# Patient Record
Sex: Female | Born: 1945 | Race: White | Hispanic: No
Health system: Southern US, Community
[De-identification: ages and names within clinical notes are randomized; demographics above are authoritative.]

---

## 2001-03-09 ENCOUNTER — Encounter: Payer: Self-pay | Admitting: Family Medicine

## 2001-03-09 ENCOUNTER — Ambulatory Visit (HOSPITAL_COMMUNITY): Admission: RE | Admit: 2001-03-09 | Discharge: 2001-03-09 | Payer: Self-pay | Admitting: Family Medicine

## 2001-04-23 ENCOUNTER — Encounter: Payer: Self-pay | Admitting: Family Medicine

## 2001-04-23 ENCOUNTER — Ambulatory Visit (HOSPITAL_COMMUNITY): Admission: RE | Admit: 2001-04-23 | Discharge: 2001-04-23 | Payer: Self-pay | Admitting: Family Medicine

## 2001-05-26 ENCOUNTER — Ambulatory Visit (HOSPITAL_COMMUNITY): Admission: RE | Admit: 2001-05-26 | Discharge: 2001-05-26 | Payer: Self-pay | Admitting: Family Medicine

## 2001-05-26 ENCOUNTER — Encounter: Payer: Self-pay | Admitting: Family Medicine

## 2001-12-02 ENCOUNTER — Encounter: Payer: Self-pay | Admitting: Family Medicine

## 2001-12-02 ENCOUNTER — Ambulatory Visit (HOSPITAL_COMMUNITY): Admission: RE | Admit: 2001-12-02 | Discharge: 2001-12-02 | Payer: Self-pay | Admitting: Family Medicine

## 2002-03-11 ENCOUNTER — Encounter: Payer: Self-pay | Admitting: Family Medicine

## 2002-03-11 ENCOUNTER — Ambulatory Visit (HOSPITAL_COMMUNITY): Admission: RE | Admit: 2002-03-11 | Discharge: 2002-03-11 | Payer: Self-pay | Admitting: Family Medicine

## 2002-03-15 ENCOUNTER — Encounter: Payer: Self-pay | Admitting: Family Medicine

## 2002-03-15 ENCOUNTER — Ambulatory Visit (HOSPITAL_COMMUNITY): Admission: RE | Admit: 2002-03-15 | Discharge: 2002-03-15 | Payer: Self-pay | Admitting: Family Medicine

## 2002-06-16 ENCOUNTER — Encounter: Payer: Self-pay | Admitting: Family Medicine

## 2002-06-16 ENCOUNTER — Ambulatory Visit (HOSPITAL_COMMUNITY): Admission: RE | Admit: 2002-06-16 | Discharge: 2002-06-16 | Payer: Self-pay | Admitting: Family Medicine

## 2002-06-23 ENCOUNTER — Encounter: Payer: Self-pay | Admitting: Family Medicine

## 2002-06-23 ENCOUNTER — Encounter: Admission: RE | Admit: 2002-06-23 | Discharge: 2002-06-23 | Payer: Self-pay | Admitting: Family Medicine

## 2002-07-06 ENCOUNTER — Encounter: Admission: RE | Admit: 2002-07-06 | Discharge: 2002-07-06 | Payer: Self-pay | Admitting: General Surgery

## 2002-07-06 ENCOUNTER — Encounter: Payer: Self-pay | Admitting: General Surgery

## 2002-07-07 ENCOUNTER — Encounter: Payer: Self-pay | Admitting: General Surgery

## 2002-07-07 ENCOUNTER — Encounter: Admission: RE | Admit: 2002-07-07 | Discharge: 2002-07-07 | Payer: Self-pay | Admitting: General Surgery

## 2002-07-07 ENCOUNTER — Ambulatory Visit (HOSPITAL_BASED_OUTPATIENT_CLINIC_OR_DEPARTMENT_OTHER): Admission: RE | Admit: 2002-07-07 | Discharge: 2002-07-07 | Payer: Self-pay | Admitting: General Surgery

## 2002-07-08 ENCOUNTER — Emergency Department (HOSPITAL_COMMUNITY): Admission: EM | Admit: 2002-07-08 | Discharge: 2002-07-09 | Payer: Self-pay | Admitting: *Deleted

## 2002-07-19 ENCOUNTER — Ambulatory Visit: Admission: RE | Admit: 2002-07-19 | Discharge: 2002-07-26 | Payer: Self-pay | Admitting: Radiation Oncology

## 2002-08-02 ENCOUNTER — Encounter: Payer: Self-pay | Admitting: Oncology

## 2002-08-02 ENCOUNTER — Ambulatory Visit (HOSPITAL_COMMUNITY): Admission: RE | Admit: 2002-08-02 | Discharge: 2002-08-02 | Payer: Self-pay | Admitting: Oncology

## 2002-08-04 ENCOUNTER — Ambulatory Visit (HOSPITAL_COMMUNITY): Admission: RE | Admit: 2002-08-04 | Discharge: 2002-08-04 | Payer: Self-pay | Admitting: Oncology

## 2002-08-04 ENCOUNTER — Encounter: Payer: Self-pay | Admitting: Oncology

## 2002-08-23 ENCOUNTER — Ambulatory Visit (HOSPITAL_BASED_OUTPATIENT_CLINIC_OR_DEPARTMENT_OTHER): Admission: RE | Admit: 2002-08-23 | Discharge: 2002-08-23 | Payer: Self-pay | Admitting: General Surgery

## 2002-08-23 ENCOUNTER — Encounter: Payer: Self-pay | Admitting: General Surgery

## 2002-09-19 ENCOUNTER — Inpatient Hospital Stay (HOSPITAL_COMMUNITY): Admission: EM | Admit: 2002-09-19 | Discharge: 2002-09-21 | Payer: Self-pay | Admitting: Emergency Medicine

## 2003-01-03 ENCOUNTER — Ambulatory Visit: Admission: RE | Admit: 2003-01-03 | Discharge: 2003-03-22 | Payer: Self-pay | Admitting: *Deleted

## 2003-01-17 ENCOUNTER — Ambulatory Visit (HOSPITAL_BASED_OUTPATIENT_CLINIC_OR_DEPARTMENT_OTHER): Admission: RE | Admit: 2003-01-17 | Discharge: 2003-01-17 | Payer: Self-pay | Admitting: General Surgery

## 2003-02-15 ENCOUNTER — Ambulatory Visit (HOSPITAL_COMMUNITY): Admission: RE | Admit: 2003-02-15 | Discharge: 2003-02-15 | Payer: Self-pay | Admitting: Ophthalmology

## 2003-06-29 ENCOUNTER — Encounter: Admission: RE | Admit: 2003-06-29 | Discharge: 2003-06-29 | Payer: Self-pay | Admitting: Oncology

## 2003-07-07 ENCOUNTER — Encounter: Admission: RE | Admit: 2003-07-07 | Discharge: 2003-07-07 | Payer: Self-pay | Admitting: Oncology

## 2003-11-22 ENCOUNTER — Emergency Department (HOSPITAL_COMMUNITY): Admission: EM | Admit: 2003-11-22 | Discharge: 2003-11-22 | Payer: Self-pay | Admitting: Emergency Medicine

## 2004-01-20 ENCOUNTER — Ambulatory Visit (HOSPITAL_COMMUNITY): Admission: RE | Admit: 2004-01-20 | Discharge: 2004-01-20 | Payer: Self-pay | Admitting: Internal Medicine

## 2004-11-20 ENCOUNTER — Ambulatory Visit: Payer: Self-pay | Admitting: Oncology

## 2004-11-23 ENCOUNTER — Encounter: Admission: RE | Admit: 2004-11-23 | Discharge: 2004-11-23 | Payer: Self-pay | Admitting: Oncology

## 2004-12-03 ENCOUNTER — Encounter: Admission: RE | Admit: 2004-12-03 | Discharge: 2004-12-03 | Payer: Self-pay | Admitting: General Surgery

## 2005-01-04 ENCOUNTER — Encounter: Admission: RE | Admit: 2005-01-04 | Discharge: 2005-01-04 | Payer: Self-pay | Admitting: General Surgery

## 2005-01-07 ENCOUNTER — Ambulatory Visit (HOSPITAL_COMMUNITY): Admission: RE | Admit: 2005-01-07 | Discharge: 2005-01-07 | Payer: Self-pay | Admitting: General Surgery

## 2005-01-07 ENCOUNTER — Ambulatory Visit (HOSPITAL_BASED_OUTPATIENT_CLINIC_OR_DEPARTMENT_OTHER): Admission: RE | Admit: 2005-01-07 | Discharge: 2005-01-07 | Payer: Self-pay | Admitting: General Surgery

## 2005-01-07 ENCOUNTER — Encounter: Admission: RE | Admit: 2005-01-07 | Discharge: 2005-01-07 | Payer: Self-pay | Admitting: General Surgery

## 2005-01-08 ENCOUNTER — Ambulatory Visit: Payer: Self-pay | Admitting: Oncology

## 2005-01-17 ENCOUNTER — Ambulatory Visit: Admission: RE | Admit: 2005-01-17 | Discharge: 2005-03-12 | Payer: Self-pay | Admitting: *Deleted

## 2005-01-24 ENCOUNTER — Ambulatory Visit (HOSPITAL_COMMUNITY): Admission: RE | Admit: 2005-01-24 | Discharge: 2005-01-24 | Payer: Self-pay | Admitting: Oncology

## 2005-01-25 ENCOUNTER — Encounter: Admission: RE | Admit: 2005-01-25 | Discharge: 2005-01-25 | Payer: Self-pay | Admitting: Oncology

## 2005-01-25 ENCOUNTER — Ambulatory Visit (HOSPITAL_COMMUNITY): Admission: RE | Admit: 2005-01-25 | Discharge: 2005-01-25 | Payer: Self-pay | Admitting: Oncology

## 2005-02-14 ENCOUNTER — Ambulatory Visit (HOSPITAL_COMMUNITY): Admission: RE | Admit: 2005-02-14 | Discharge: 2005-02-14 | Payer: Self-pay | Admitting: Oncology

## 2005-02-26 ENCOUNTER — Ambulatory Visit: Payer: Self-pay | Admitting: Oncology

## 2005-03-08 ENCOUNTER — Emergency Department (HOSPITAL_COMMUNITY): Admission: EM | Admit: 2005-03-08 | Discharge: 2005-03-09 | Payer: Self-pay | Admitting: Emergency Medicine

## 2005-04-15 ENCOUNTER — Ambulatory Visit: Payer: Self-pay | Admitting: Oncology

## 2005-04-24 ENCOUNTER — Ambulatory Visit (HOSPITAL_COMMUNITY): Admission: RE | Admit: 2005-04-24 | Discharge: 2005-04-24 | Payer: Self-pay | Admitting: Oncology

## 2005-04-29 ENCOUNTER — Inpatient Hospital Stay (HOSPITAL_COMMUNITY): Admission: EM | Admit: 2005-04-29 | Discharge: 2005-05-02 | Payer: Self-pay | Admitting: Emergency Medicine

## 2005-05-06 ENCOUNTER — Ambulatory Visit (HOSPITAL_COMMUNITY): Admission: RE | Admit: 2005-05-06 | Discharge: 2005-05-06 | Payer: Self-pay | Admitting: Oncology

## 2005-05-07 ENCOUNTER — Ambulatory Visit (HOSPITAL_COMMUNITY): Payer: Self-pay | Admitting: Oncology

## 2005-05-07 ENCOUNTER — Encounter: Admission: RE | Admit: 2005-05-07 | Discharge: 2005-05-07 | Payer: Self-pay | Admitting: Oncology

## 2005-05-07 ENCOUNTER — Encounter (HOSPITAL_COMMUNITY): Admission: RE | Admit: 2005-05-07 | Discharge: 2005-06-06 | Payer: Self-pay | Admitting: Oncology

## 2005-05-08 ENCOUNTER — Ambulatory Visit (HOSPITAL_COMMUNITY): Payer: Self-pay | Admitting: Oncology

## 2005-05-16 ENCOUNTER — Ambulatory Visit: Admission: RE | Admit: 2005-05-16 | Discharge: 2005-07-24 | Payer: Self-pay | Admitting: *Deleted

## 2005-05-31 ENCOUNTER — Ambulatory Visit: Payer: Self-pay | Admitting: Oncology

## 2005-07-19 ENCOUNTER — Ambulatory Visit: Payer: Self-pay | Admitting: Oncology

## 2005-08-13 ENCOUNTER — Ambulatory Visit (HOSPITAL_COMMUNITY): Admission: RE | Admit: 2005-08-13 | Discharge: 2005-08-13 | Payer: Self-pay | Admitting: Oncology

## 2005-08-28 ENCOUNTER — Ambulatory Visit (HOSPITAL_COMMUNITY): Payer: Self-pay | Admitting: Oncology

## 2005-08-28 ENCOUNTER — Encounter: Admission: RE | Admit: 2005-08-28 | Discharge: 2005-08-28 | Payer: Self-pay | Admitting: Oncology

## 2005-08-28 ENCOUNTER — Ambulatory Visit: Payer: Self-pay | Admitting: *Deleted

## 2005-09-04 ENCOUNTER — Ambulatory Visit: Payer: Self-pay | Admitting: *Deleted

## 2005-09-06 ENCOUNTER — Ambulatory Visit: Payer: Self-pay | Admitting: Oncology

## 2005-09-12 ENCOUNTER — Ambulatory Visit: Payer: Self-pay | Admitting: *Deleted

## 2005-11-14 ENCOUNTER — Encounter: Admission: RE | Admit: 2005-11-14 | Discharge: 2005-11-14 | Payer: Self-pay | Admitting: Oncology

## 2005-11-22 ENCOUNTER — Ambulatory Visit: Payer: Self-pay | Admitting: Oncology

## 2006-02-19 ENCOUNTER — Ambulatory Visit: Payer: Self-pay | Admitting: Oncology

## 2006-04-27 IMAGING — CR DG CHEST 2V
2 series · 2 of 2 positions shown · non-contrast
Comparison: 01/04/05.

CLINICAL DATA: Fever.
 CHEST ? 2 VIEW:

[view not recorded (1 of 2)]
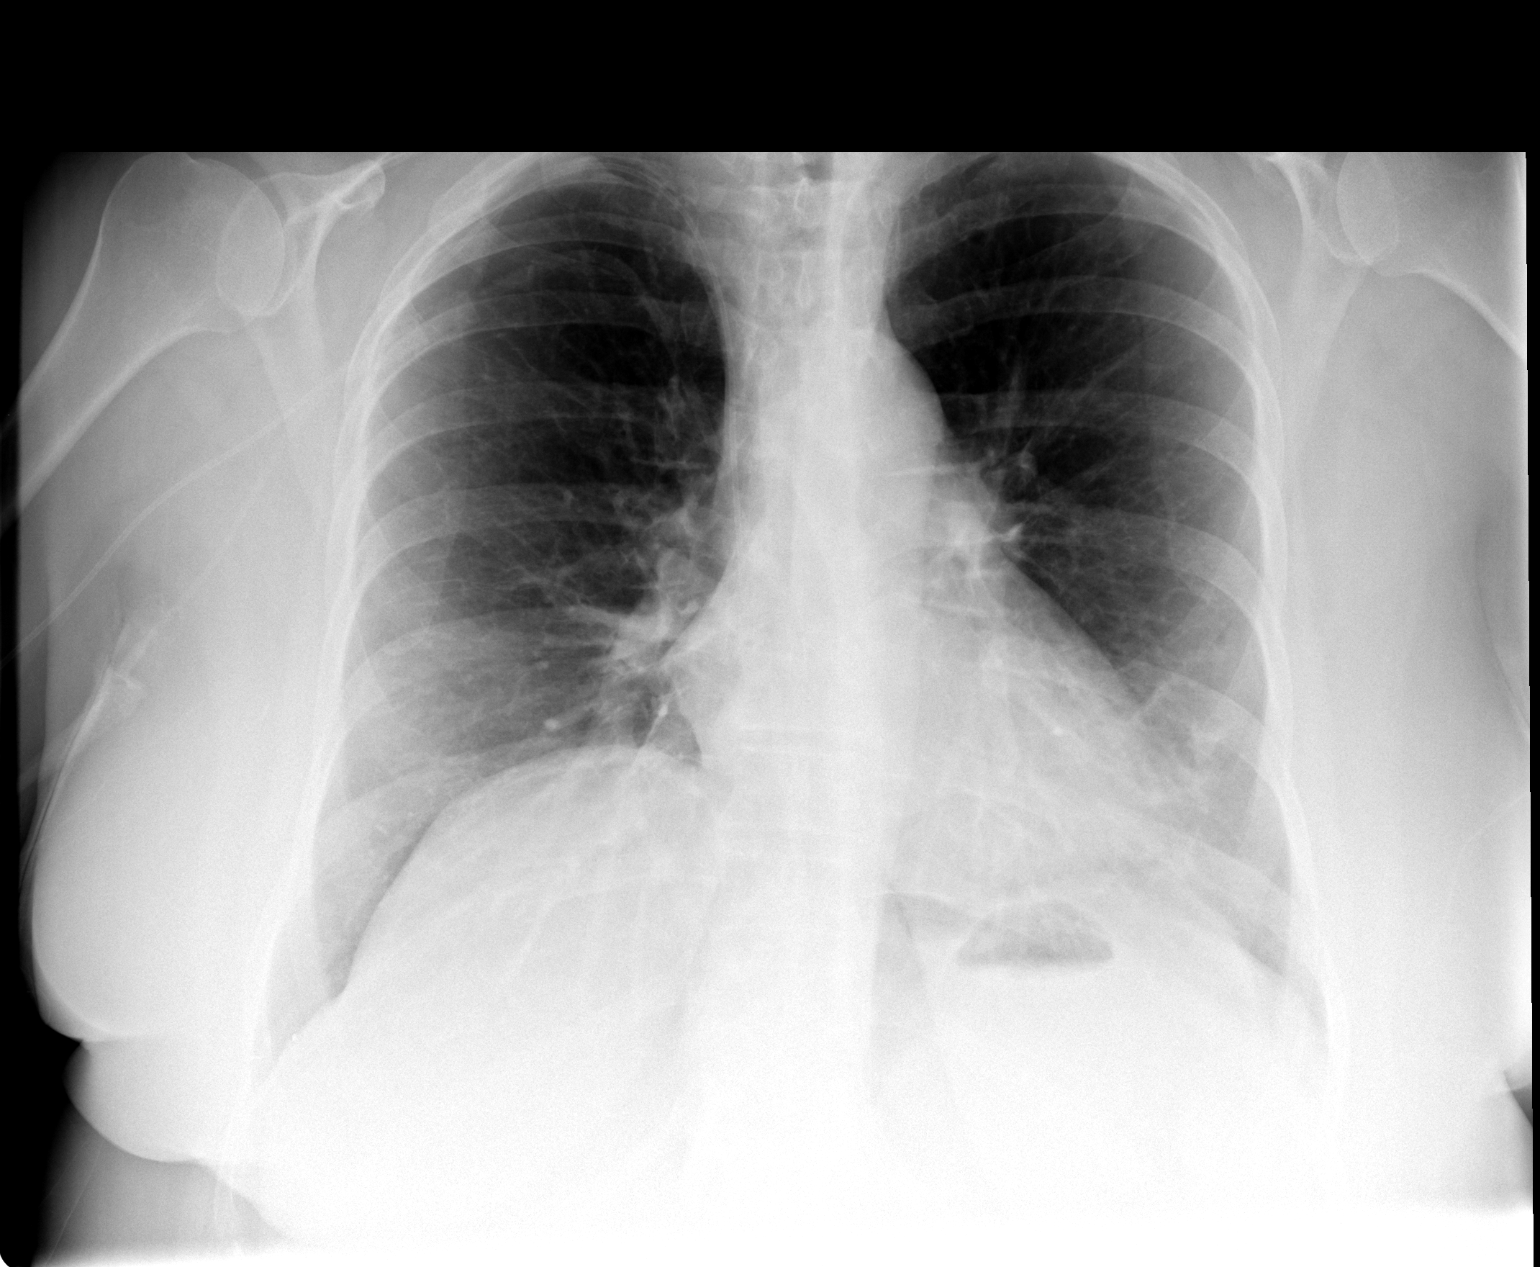

[view not recorded (2 of 2)]
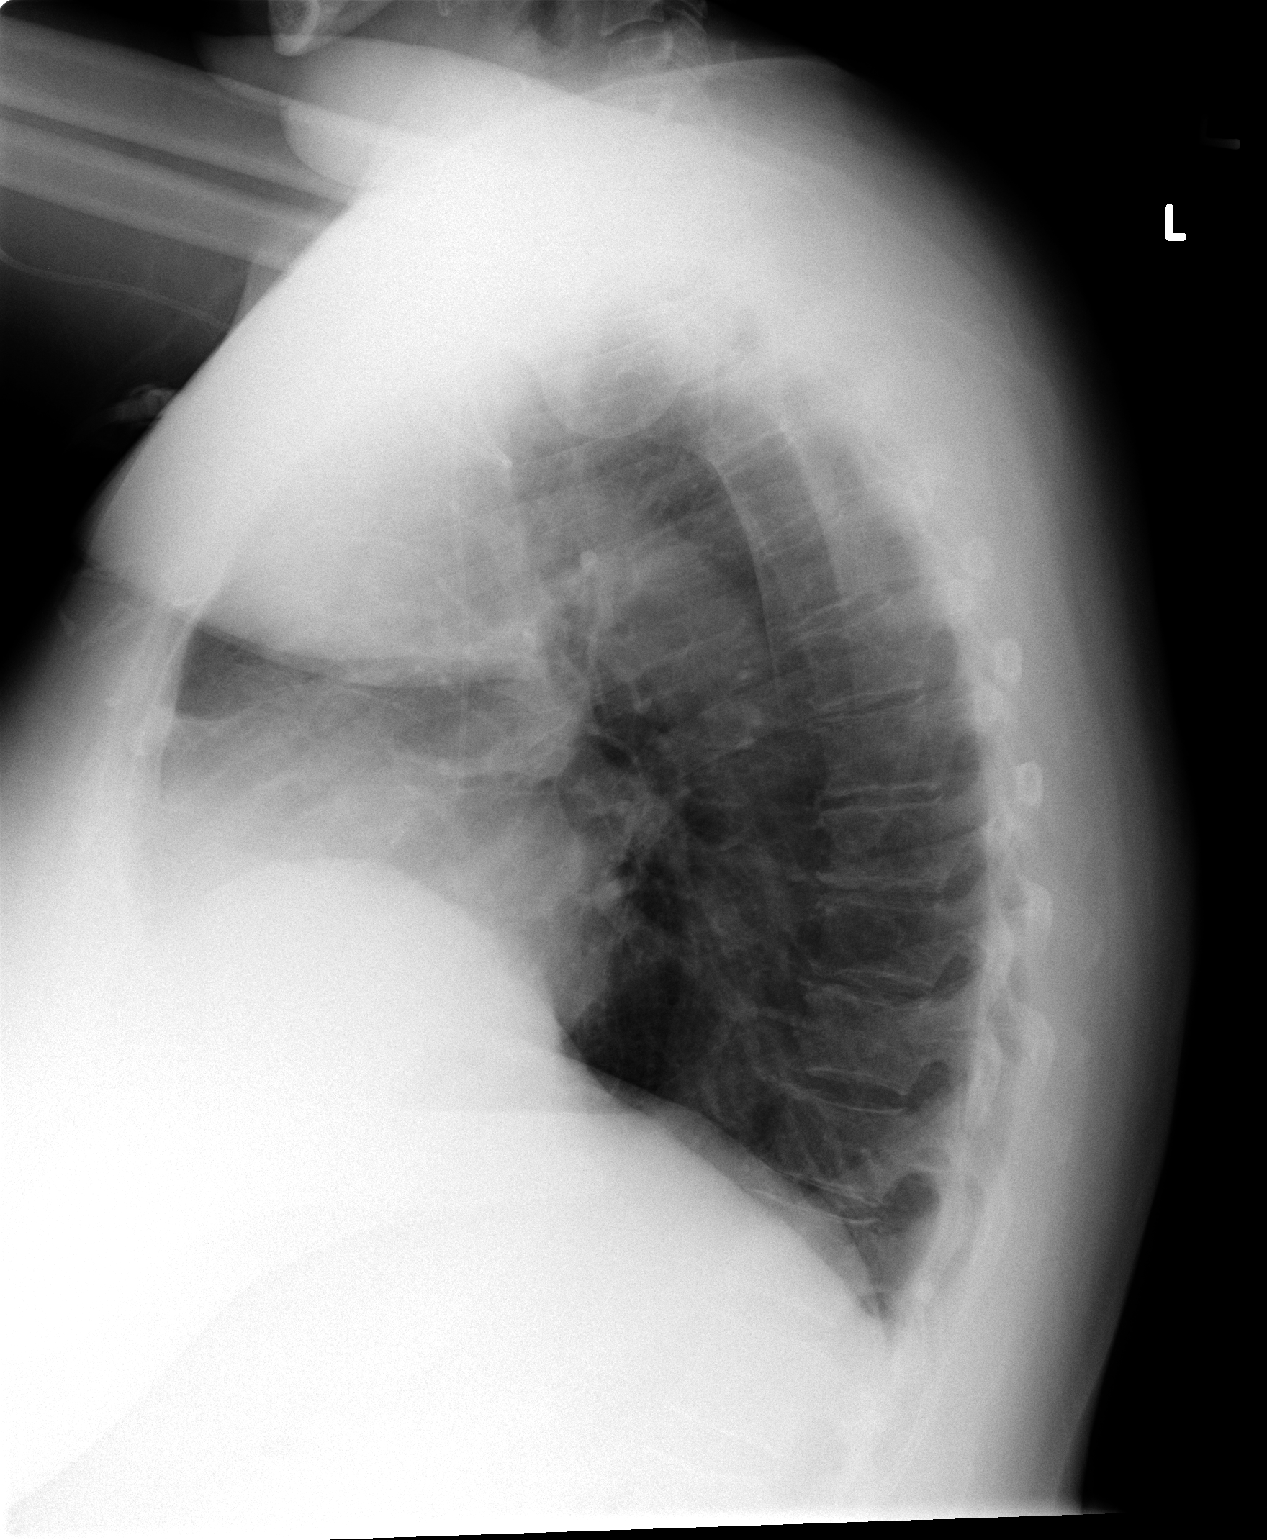

[2 of 2 positions shown; findings below may reference images not displayed]

FINDINGS: Scarring in the lingula is stable.   The lungs are otherwise clear.   The heart is normal in size.   No pneumothoraces or effusions are seen.   A right upper extremity peripheral venous catheter has been placed.   The tip is at the cavoatrial junction.
IMPRESSION: 1.   Right upper extremity venous catheter placement.   
 2.   Scarring in the lingula.   Stable.

## 2010-08-26 ENCOUNTER — Encounter: Payer: Self-pay | Admitting: Emergency Medicine

## 2016-03-18 ENCOUNTER — Telehealth: Payer: Self-pay | Admitting: Genetic Counselor

## 2016-03-18 NOTE — Telephone Encounter (Signed)
Pt LM on my VM to please call her back.  Provided me her DOB as well.  I called and LM on her VM stating that I was calling her back and that I was in clinic today.  The best time to reach me would be between 11:30-1 PM.

## 2016-03-19 NOTE — Telephone Encounter (Signed)
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   Her daughter is approaching the age that she was when she developed cancer and her dr is wanting her to undergo genetic testing.  Insurance will not cover testing until they know what kind of cancer Christina Farmer had.  Christina Farmer is looking into this for her.
